# Patient Record
Sex: Female | Born: 1980
Health system: Southern US, Community
[De-identification: ages and names within clinical notes are randomized; demographics above are authoritative.]

## PROBLEM LIST (undated history)

## (undated) HISTORY — PX: FOOT SURGERY: SHX648

## (undated) HISTORY — PX: TONSILLECTOMY: SUR1361

---

## 2009-08-08 ENCOUNTER — Emergency Department (HOSPITAL_BASED_OUTPATIENT_CLINIC_OR_DEPARTMENT_OTHER): Admission: EM | Admit: 2009-08-08 | Discharge: 2009-08-08 | Payer: Self-pay | Admitting: Emergency Medicine

## 2009-08-08 ENCOUNTER — Ambulatory Visit: Payer: Self-pay | Admitting: Diagnostic Radiology

## 2010-04-26 ENCOUNTER — Emergency Department (HOSPITAL_BASED_OUTPATIENT_CLINIC_OR_DEPARTMENT_OTHER)
Admission: EM | Admit: 2010-04-26 | Discharge: 2010-04-26 | Payer: Self-pay | Source: Home / Self Care | Admitting: Emergency Medicine
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
## 2013-11-02 ENCOUNTER — Encounter (HOSPITAL_BASED_OUTPATIENT_CLINIC_OR_DEPARTMENT_OTHER): Payer: Self-pay | Admitting: Emergency Medicine

## 2013-11-02 ENCOUNTER — Emergency Department (HOSPITAL_BASED_OUTPATIENT_CLINIC_OR_DEPARTMENT_OTHER)
Admission: EM | Admit: 2013-11-02 | Discharge: 2013-11-02 | Disposition: A | Discharge status: Home / Self Care | Payer: Self-pay | Attending: Emergency Medicine | Admitting: Emergency Medicine

## 2013-11-02 DIAGNOSIS — R109 Unspecified abdominal pain: Secondary | ICD-10-CM | POA: Insufficient documentation

## 2013-11-02 DIAGNOSIS — R112 Nausea with vomiting, unspecified: Secondary | ICD-10-CM | POA: Insufficient documentation

## 2013-11-02 DIAGNOSIS — R197 Diarrhea, unspecified: Secondary | ICD-10-CM | POA: Insufficient documentation

## 2013-11-02 DIAGNOSIS — F17200 Nicotine dependence, unspecified, uncomplicated: Secondary | ICD-10-CM | POA: Insufficient documentation

## 2013-11-02 DIAGNOSIS — Z3202 Encounter for pregnancy test, result negative: Secondary | ICD-10-CM | POA: Insufficient documentation

## 2013-11-02 LAB — COMPREHENSIVE METABOLIC PANEL
ALT: 7 U/L (ref 0–35)
AST: 18 U/L (ref 0–37)
Albumin: 3.9 g/dL (ref 3.5–5.2)
Alkaline Phosphatase: 52 U/L (ref 39–117)
Anion gap: 13 (ref 5–15)
BUN: 9 mg/dL (ref 6–23)
CALCIUM: 9.7 mg/dL (ref 8.4–10.5)
CHLORIDE: 103 meq/L (ref 96–112)
CO2: 24 mEq/L (ref 19–32)
Creatinine, Ser: 0.9 mg/dL (ref 0.50–1.10)
GFR calc non Af Amer: 83 mL/min — ABNORMAL LOW (ref >90)
Glucose, Bld: 118 mg/dL — ABNORMAL HIGH (ref 70–99)
Potassium: 3.8 mEq/L (ref 3.7–5.3)
SODIUM: 140 meq/L (ref 137–147)
Total Bilirubin: 0.3 mg/dL (ref 0.3–1.2)
Total Protein: 7.4 g/dL (ref 6.0–8.3)

## 2013-11-02 LAB — URINALYSIS, ROUTINE W REFLEX MICROSCOPIC
Glucose, UA: NEGATIVE mg/dL
KETONES UR: NEGATIVE mg/dL
Nitrite: NEGATIVE
PH: 5.5 (ref 5.0–8.0)
Protein, ur: NEGATIVE mg/dL
Specific Gravity, Urine: 1.025 (ref 1.005–1.030)
UROBILINOGEN UA: 1 mg/dL (ref 0.0–1.0)

## 2013-11-02 LAB — CBC WITH DIFFERENTIAL/PLATELET
BASOS ABS: 0 10*3/uL (ref 0.0–0.1)
Basophils Relative: 0 % (ref 0–1)
Eosinophils Absolute: 0.4 10*3/uL (ref 0.0–0.7)
Eosinophils Relative: 4 % (ref 0–5)
HEMATOCRIT: 38.4 % (ref 36.0–46.0)
Hemoglobin: 14.5 g/dL (ref 12.0–15.0)
LYMPHS ABS: 2.3 10*3/uL (ref 0.7–4.0)
LYMPHS PCT: 22 % (ref 12–46)
MCH: 30.5 pg (ref 26.0–34.0)
MCHC: 37.8 g/dL — ABNORMAL HIGH (ref 30.0–36.0)
MCV: 80.7 fL (ref 78.0–100.0)
MONO ABS: 0.5 10*3/uL (ref 0.1–1.0)
Monocytes Relative: 5 % (ref 3–12)
NEUTROS ABS: 7.3 10*3/uL (ref 1.7–7.7)
Neutrophils Relative %: 69 % (ref 43–77)
PLATELETS: 250 10*3/uL (ref 150–400)
RBC: 4.76 MIL/uL (ref 3.87–5.11)
RDW: 13 % (ref 11.5–15.5)
WBC: 10.5 10*3/uL (ref 4.0–10.5)

## 2013-11-02 LAB — URINE MICROSCOPIC-ADD ON

## 2013-11-02 LAB — LIPASE, BLOOD: Lipase: 21 U/L (ref 11–59)

## 2013-11-02 LAB — PREGNANCY, URINE: PREG TEST UR: NEGATIVE

## 2013-11-02 MED ORDER — ONDANSETRON HCL 4 MG PO TABS: 4.0000 mg | ORAL_TABLET | Freq: Four times a day (QID) | ORAL | Status: DC

## 2013-11-02 MED ORDER — ONDANSETRON HCL 4 MG/2ML IJ SOLN
4.0000 mg | Freq: Once | INTRAMUSCULAR | Status: AC
  Administered 2013-11-02: 4 mg via INTRAVENOUS
  Filled 2013-11-02: qty 2

## 2013-11-02 MED ORDER — SODIUM CHLORIDE 0.9 % IV BOLUS (SEPSIS)
1000.0000 mL | Freq: Once | INTRAVENOUS | Status: AC
Start: 2013-11-02 — End: 2013-11-02
  Administered 2013-11-02: 1000 mL via INTRAVENOUS
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
## 2013-11-02 NOTE — ED Notes (Addendum)
Patient here with abdominal camping with vomiting and diarrhea with any intake. Reports that she is vomiting a couple of times each day, no other associated symptoms. Symptoms have persisted x 5 days.

## 2013-11-02 NOTE — Discharge Instructions (Signed)

## 2013-11-02 NOTE — ED Notes (Signed)
Tolerating fluids.

## 2014-04-23 ENCOUNTER — Encounter (HOSPITAL_BASED_OUTPATIENT_CLINIC_OR_DEPARTMENT_OTHER): Payer: Self-pay | Admitting: Emergency Medicine

## 2014-04-23 ENCOUNTER — Emergency Department (HOSPITAL_BASED_OUTPATIENT_CLINIC_OR_DEPARTMENT_OTHER)
Admission: EM | Admit: 2014-04-23 | Discharge: 2014-04-23 | Disposition: A | Discharge status: Home / Self Care | Payer: Self-pay | Attending: Emergency Medicine | Admitting: Emergency Medicine

## 2014-04-23 DIAGNOSIS — X58XXXD Exposure to other specified factors, subsequent encounter: Secondary | ICD-10-CM | POA: Insufficient documentation

## 2014-04-23 DIAGNOSIS — S39012D Strain of muscle, fascia and tendon of lower back, subsequent encounter: Secondary | ICD-10-CM | POA: Insufficient documentation

## 2014-04-23 DIAGNOSIS — Z720 Tobacco use: Secondary | ICD-10-CM | POA: Insufficient documentation

## 2014-04-23 MED ORDER — HYDROCODONE-ACETAMINOPHEN 5-325 MG PO TABS: 1.0000 | ORAL_TABLET | Freq: Four times a day (QID) | ORAL | Status: DC | PRN
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
## 2015-07-29 ENCOUNTER — Emergency Department (HOSPITAL_BASED_OUTPATIENT_CLINIC_OR_DEPARTMENT_OTHER)
Admission: EM | Admit: 2015-07-29 | Discharge: 2015-07-29 | Disposition: A | Discharge status: Home / Self Care | Payer: No Typology Code available for payment source | Attending: Emergency Medicine | Admitting: Emergency Medicine

## 2015-07-29 ENCOUNTER — Encounter (HOSPITAL_BASED_OUTPATIENT_CLINIC_OR_DEPARTMENT_OTHER): Payer: Self-pay | Admitting: Emergency Medicine

## 2015-07-29 DIAGNOSIS — F17210 Nicotine dependence, cigarettes, uncomplicated: Secondary | ICD-10-CM | POA: Insufficient documentation

## 2015-07-29 DIAGNOSIS — K029 Dental caries, unspecified: Secondary | ICD-10-CM | POA: Insufficient documentation

## 2015-07-29 MED ORDER — IBUPROFEN 800 MG PO TABS: 800.0000 mg | ORAL_TABLET | Freq: Three times a day (TID) | ORAL | Status: AC

## 2015-07-29 MED ORDER — PENICILLIN V POTASSIUM 500 MG PO TABS: 500.0000 mg | ORAL_TABLET | Freq: Three times a day (TID) | ORAL | Status: DC

## 2015-07-29 MED FILL — IBUPROFEN 800 MG TABLET: 800 | 7 days supply | Qty: 21 | Fill #0

## 2015-07-29 MED FILL — PENICILLIN VK 500 MG TABLET: 500 | 10 days supply | Qty: 30 | Fill #0

## 2015-07-29 NOTE — Discharge Instructions (Signed)
Dental Pain °Dental pain may be caused by many things, including: °· Tooth decay (cavities or caries). Cavities cause the nerve of your tooth to be open to air and hot or cold temperatures. This can cause pain or discomfort. °· Abscess or infection. A dental abscess is an area that is full of infected pus from a bacterial infection in the inner part of the tooth (pulp). It usually happens at the end of the tooth's root. °· Injury. °· An unknown reason (idiopathic). °Your pain may be mild or severe. It may only happen when: °· You are chewing. °· You are exposed to hot or cold temperature. °· You are eating or drinking sugary foods or beverages, such as: °¨ Soda. °¨ Candy. °Your pain may also be there all of the time. °HOME CARE °Watch your dental pain for any changes. Do these things to lessen your discomfort: °· Take medicines only as told by your dentist. °· If your dentist tells you to take an antibiotic medicine, finish all of it even if you start to feel better. °· Keep all follow-up visits as told by your dentist. This is important. °· Do not apply heat to the outside of your face. °· Rinse your mouth or gargle with salt water if told by your dentist. This helps with pain and swelling. °¨ You can make salt water by adding ¼ tsp of salt to 1 cup of warm water. °· Apply ice to the painful area of your face: °¨ Put ice in a plastic bag. °¨ Place a towel between your skin and the bag. °¨ Leave the ice on for 20 minutes, 2-3 times per day. °· Avoid foods or drinks that cause you pain, such as: °¨ Very hot or very cold foods or drinks. °¨ Sweet or sugary foods or drinks. °GET HELP IF: °· Your pain is not helped with medicines. °· Your symptoms are worse. °· You have new symptoms. °GET HELP RIGHT AWAY IF: °· You cannot open your mouth. °· You are having trouble breathing or swallowing. °· You have a fever. °· Your face, neck, or jaw is puffy (swollen). °  °This information is not intended to replace advice given to  you by your health care provider. Make sure you discuss any questions you have with your health care provider. °  °Document Released: 08/31/2007 Document Revised: 07/29/2014 Document Reviewed: 03/10/2014 °Elsevier Interactive Patient Education ©2016 [employer] ° °

## 2015-07-29 NOTE — ED Provider Notes (Signed)
CSN: [id number]     Arrival date & time 07/29/15  1602 History   First MD Initiated Contact with Patient 07/29/15 1609     Chief Complaint  Patient presents with  . Dental Pain     (Consider location/radiation/quality/duration/timing/severity/associated sxs/prior Treatment) HPI   35 year old female presents for evaluation of dental pain. Patient reports she broke her left lower tooth approximately 2 weeks ago but for the past 4 days she has had progressive worsening throbbing achy pain which she rates as 8 out of 10, nonradiating, worsening with chewing, talking and with temperature changes. No associated fever, throat swelling, ear pain, or neck pain. No specific treatment tried. She did call her dentist and her next available appointment is June 15. She did try to call around to find a different dentist but unable to find anyone to follow-up. She denies any pain to the same tooth in the past.  No past medical history on file. No past surgical history on file. No family history on file. Social History  Substance Use Topics  . Smoking status: Current Every Day Smoker    Types: Cigarettes  . Smokeless tobacco: Not on file  . Alcohol Use: No   OB History    No data available     Review of Systems  Constitutional: Negative for fever.  HENT: Positive for dental problem.   Skin: Negative for rash and wound.  Neurological: Negative for numbness.      Allergies  Review of patient's allergies indicates no known allergies.  Home Medications   Prior to Admission medications   Medication Sig Start Date End Date Taking? Authorizing Provider  HYDROcodone-acetaminophen (NORCO/VICODIN) 5-325 MG per tablet Take 1-2 tablets by mouth every 6 (six) hours as needed (for pain; may cause drowsiness or constipation). 04/23/14   John Molpus, MD  ondansetron (ZOFRAN) 4 MG tablet Take 1 tablet (4 mg total) by mouth every 6 (six) hours. 11/02/13   Toy CookeyMegan Docherty, MD   There were no vitals taken for  this visit. Physical Exam  Constitutional: She appears well-developed and well-nourished. No distress.  HENT:  Head: Atraumatic.  Oral cavity: Dental decay noted to tooth #17 with tenderness to palpation but no surrounding erythema or abscess. No trismus. Left ear with normal TM and nontender to palpation.   Eyes: Conjunctivae are normal.  Neck: Neck supple.  Neurological: She is alert.  Skin: No rash noted.  Psychiatric: She has a normal mood and affect.  Nursing note and vitals reviewed.   ED Course  Procedures (including critical care time)   MDM   Final diagnoses:  Pain due to dental caries    BP 114/83 mmHg  Pulse 82  Temp(Src) 98.5 F (36.9 C) (Oral)  Resp 16  Ht 5\' 4"  (1.626 m)  Wt 72.576 kg  BMI 27.45 kg/m2  SpO2 99%  LMP 06/29/2015     Fayrene HelperBowie Celesta Funderburk, PA-C 07/29/15 1618  Pricilla LovelessScott Goldston, MD 07/31/15 [phone]

## 2015-07-29 NOTE — ED Notes (Signed)
PA at bedside.

## 2015-07-29 NOTE — ED Notes (Addendum)
Pt broke tooth on left bottom 2 weeks ago.  Pain x3 days.  Dental appt June 15th.

## 2016-01-23 ENCOUNTER — Emergency Department (HOSPITAL_BASED_OUTPATIENT_CLINIC_OR_DEPARTMENT_OTHER)
Admission: EM | Admit: 2016-01-23 | Discharge: 2016-01-23 | Disposition: A | Discharge status: Home / Self Care | Payer: Self-pay | Attending: Emergency Medicine | Admitting: Emergency Medicine

## 2016-01-23 ENCOUNTER — Encounter (HOSPITAL_BASED_OUTPATIENT_CLINIC_OR_DEPARTMENT_OTHER): Payer: Self-pay

## 2016-01-23 DIAGNOSIS — K0889 Other specified disorders of teeth and supporting structures: Secondary | ICD-10-CM | POA: Insufficient documentation

## 2016-01-23 DIAGNOSIS — Z79899 Other long term (current) drug therapy: Secondary | ICD-10-CM | POA: Insufficient documentation

## 2016-01-23 DIAGNOSIS — F17210 Nicotine dependence, cigarettes, uncomplicated: Secondary | ICD-10-CM | POA: Insufficient documentation

## 2016-01-23 MED ORDER — PENICILLIN V POTASSIUM 500 MG PO TABS: 500.0000 mg | ORAL_TABLET | Freq: Four times a day (QID) | ORAL | 0 refills | Status: AC

## 2016-01-23 MED ORDER — HYDROCODONE-ACETAMINOPHEN 5-325 MG PO TABS: 1.0000 | ORAL_TABLET | ORAL | 0 refills | Status: AC | PRN

## 2016-01-23 MED ORDER — FLUCONAZOLE 150 MG PO TABS: ORAL_TABLET | ORAL | 0 refills | Status: AC

## 2016-01-23 MED ORDER — BUPIVACAINE-EPINEPHRINE (PF) 0.5% -1:200000 IJ SOLN
INTRAMUSCULAR | Status: AC
  Administered 2016-01-23: 1.8 mL
  Filled 2016-01-23: qty 1.8

## 2016-01-23 MED ORDER — PENICILLIN V POTASSIUM 250 MG PO TABS
500.0000 mg | ORAL_TABLET | Freq: Once | ORAL | Status: AC
  Administered 2016-01-23: 500 mg via ORAL
  Filled 2016-01-23: qty 2

## 2016-01-23 MED ORDER — BUPIVACAINE-EPINEPHRINE (PF) 0.5% -1:200000 IJ SOLN
1.8000 mL | Freq: Once | INTRAMUSCULAR | Status: AC
  Administered 2016-01-23: 1.8 mL

## 2016-01-23 NOTE — ED Provider Notes (Addendum)
[facility] Provider Note: Sabrina Weiss Sabrina Sakuma, MD, FACEP  CSN: [id number] MRN: [id number] ARRIVAL: 01/23/16 at 0206 ROOM: MH09/MH09   CHIEF COMPLAINT  Dental Pain   HISTORY OF PRESENT ILLNESS  Sabrina Weiss is a 35 y.o. female who fractured her left lower third molar several months ago. She has been seen for this in the past. She was unable to follow-up with a dentist due to financial reasons. She is here with severe pain in that tooth that developed over the past 2 days. Pain is worse with eating or drinking. There is associated adjacent soft tissue swelling.   History reviewed. No pertinent past medical history.  Past Surgical History:  Procedure Laterality Date  . FOOT SURGERY    . TONSILLECTOMY      No family history on file.  Social History  Substance Use Topics  . Smoking status: Current Every Day Smoker    Packs/day: 0.50    Types: Cigarettes  . Smokeless tobacco: Never Used  . Alcohol use No    Prior to Admission medications   Medication Sig Start Date End Date Taking? Authorizing Provider  HYDROcodone-acetaminophen (NORCO) 5-325 MG tablet Take 1-2 tablets by mouth every 4 (four) hours as needed (for pain). 01/23/16   Nitasha Jewel, MD  ibuprofen (ADVIL,MOTRIN) 800 MG tablet Take 1 tablet (800 mg total) by mouth 3 (three) times daily. 07/29/15   Fayrene HelperBowie Tran, PA-C  penicillin v potassium (VEETID) 500 MG tablet Take 1 tablet (500 mg total) by mouth 4 (four) times daily. 01/23/16   Paula LibraJohn Lily Velasquez, MD    Allergies Review of patient's allergies indicates no known allergies.   REVIEW OF SYSTEMS  Negative except as noted here or in the History of Present Illness.   PHYSICAL EXAMINATION  Initial Vital Signs Blood pressure 132/99, pulse 86, temperature 97.4 F (36.3 C), temperature source Oral, resp. rate 18, height 5\' 4"  (1.626 m), weight 150 lb (68 kg), last menstrual period 01/03/2016, SpO2 100 %.  Examination General: Well-developed, well-nourished  female in no acute distress; appearance consistent with age of record HENT: normocephalic; atraumatic; fractured left lower third molar with tenderness to percussion, adjacent soft tissue swelling Eyes: pupils equal, round and reactive to light; extraocular muscles intact Neck: supple; no lymphadenopathy Heart: regular rate and rhythm Lungs: clear to auscultation bilaterally Abdomen: soft; nondistended Extremities: No deformity; full range of motion Neurologic: Awake, alert and oriented; motor function intact in all extremities and symmetric; no facial droop Skin: Warm and dry Psychiatric: Tearful   RESULTS  Summary of this visit's results, reviewed by myself:   EKG Interpretation  Date/Time:    Ventricular Rate:    PR Interval:    QRS Duration:   QT Interval:    QTC Calculation:   R Axis:     Text Interpretation:        Laboratory Studies: No results found for this or any previous visit (from the past 24 hour(s)). Imaging Studies: No results found.  ED COURSE  Nursing notes and initial vitals signs, including pulse oximetry, reviewed.  Vitals:   01/23/16 0209  BP: 132/99  Pulse: 86  Resp: 18  Temp: 97.4 F (36.3 C)  TempSrc: Oral  SpO2: 100%  Weight: 150 lb (68 kg)  Height: 5\' 4"  (1.626 m)    PROCEDURES   DENTAL BLOCK 1.8 milliliters of 0.5% bupivacaine with epinephrine were injected into the buccal fold adjacent to the left lower third molar. The patient tolerated this well and there were  no immediate complications. Adequate analgesia was obtained.   ED DIAGNOSES     ICD-9-CM ICD-10-CM   1. Pain, dental 525.9 K08.89        Paula LibraJohn Jebediah Macrae, MD 01/23/16 0300    Paula LibraJohn Margeart Allender, MD 01/23/16 [phone]

## 2016-01-23 NOTE — ED Triage Notes (Signed)
Pt states chipped her lt bottom tooth a few months ago, now having swelling and pain x2 days

## 2020-01-30 ENCOUNTER — Other Ambulatory Visit: Payer: Self-pay

## 2020-01-30 DIAGNOSIS — Z8669 Personal history of other diseases of the nervous system and sense organs: Secondary | ICD-10-CM | POA: Insufficient documentation

## 2020-01-30 DIAGNOSIS — R42 Dizziness and giddiness: Secondary | ICD-10-CM | POA: Insufficient documentation

## 2020-01-30 DIAGNOSIS — R519 Headache, unspecified: Secondary | ICD-10-CM | POA: Insufficient documentation

## 2020-01-30 DIAGNOSIS — F17210 Nicotine dependence, cigarettes, uncomplicated: Secondary | ICD-10-CM | POA: Insufficient documentation

## 2020-01-31 ENCOUNTER — Other Ambulatory Visit: Payer: Self-pay

## 2020-01-31 ENCOUNTER — Encounter (HOSPITAL_BASED_OUTPATIENT_CLINIC_OR_DEPARTMENT_OTHER): Payer: Self-pay | Admitting: *Deleted

## 2020-01-31 ENCOUNTER — Emergency Department (HOSPITAL_BASED_OUTPATIENT_CLINIC_OR_DEPARTMENT_OTHER)
Admission: EM | Admit: 2020-01-31 | Discharge: 2020-01-31 | Disposition: A | Discharge status: Home / Self Care | Payer: Self-pay | Attending: Emergency Medicine | Admitting: Emergency Medicine

## 2020-01-31 DIAGNOSIS — R519 Headache, unspecified: Secondary | ICD-10-CM

## 2020-01-31 MED ORDER — PROMETHAZINE HCL 25 MG/ML IJ SOLN
25.0000 mg | Freq: Once | INTRAMUSCULAR | Status: AC
  Administered 2020-01-31: 25 mg via INTRAMUSCULAR
  Filled 2020-01-31: qty 1

## 2020-01-31 MED ORDER — KETOROLAC TROMETHAMINE 60 MG/2ML IM SOLN
60.0000 mg | Freq: Once | INTRAMUSCULAR | Status: AC
  Administered 2020-01-31: 60 mg via INTRAMUSCULAR
  Filled 2020-01-31: qty 2

## 2020-01-31 NOTE — ED Notes (Signed)
Discharge instructions discussed with patient. Verbalized understanding. Departs ED at this time in stable condition.  

## 2020-01-31 NOTE — ED Triage Notes (Signed)
C/o left sided h/a and lightheaded  x 3 hrs

## 2020-01-31 NOTE — ED Provider Notes (Signed)
[facility] EMERGENCY DEPARTMENT Provider Note   CSN: [id number] Arrival date & time: 01/30/20  2356     History Chief Complaint  Patient presents with  . Headache    Sabrina Weiss is a 39 y.o. female.  Patient is a 39 year old female with no significant past medical history.  She presents today for evaluation of headache.  This started approximately 9:00 this evening while she was getting ready for work.  She describes pain to the left side of her head with no associated nausea or visual disturbances.  She denies any numbness or tingling.  She felt dizzy and did not feel as though she could make it to work.  She does report a history of migraine headaches in the past and this feels somewhat similar.  She does report daily caffeine intake.  The history is provided by the patient.       History reviewed. No pertinent past medical history.  There are no problems to display for this patient.   Past Surgical History:  Procedure Laterality Date  . FOOT SURGERY    . TONSILLECTOMY       OB History   No obstetric history on file.     No family history on file.  Social History   Tobacco Use  . Smoking status: Current Every Day Smoker    Packs/day: 0.50    Types: Cigarettes  . Smokeless tobacco: Never Used  Substance Use Topics  . Alcohol use: No  . Drug use: No    Home Medications Prior to Admission medications   Medication Sig Start Date End Date Taking? Authorizing Provider  fluconazole (DIFLUCAN) 150 MG tablet Take one tablet as needed for vaginal yeast infection. May repeat in 3 days if necessary. 01/23/16   Molpus, John, MD  HYDROcodone-acetaminophen (NORCO) 5-325 MG tablet Take 1-2 tablets by mouth every 4 (four) hours as needed (for pain). 01/23/16   Molpus, John, MD  ibuprofen (ADVIL,MOTRIN) 800 MG tablet Take 1 tablet (800 mg total) by mouth 3 (three) times daily. 07/29/15   Fayrene Helper, PA-C  penicillin v potassium (VEETID) 500 MG tablet  Take 1 tablet (500 mg total) by mouth 4 (four) times daily. 01/23/16   Molpus, Jonny Ruiz, MD    Allergies    Patient has no known allergies.  Review of Systems   Review of Systems  All other systems reviewed and are negative.   Physical Exam Updated Vital Signs BP 126/82   Pulse 73   Temp 97.7 F (36.5 C) (Oral)   Resp 18   Ht 5\' 3"  (1.6 m)   Wt 72.6 kg   LMP 01/08/2020   SpO2 100%   BMI 28.34 kg/m   Physical Exam Vitals and nursing note reviewed.  Constitutional:      General: She is not in acute distress.    Appearance: She is well-developed. She is not diaphoretic.  HENT:     Head: Normocephalic and atraumatic.  Eyes:     General: No visual field deficit.    Extraocular Movements: Extraocular movements intact.  Cardiovascular:     Rate and Rhythm: Normal rate and regular rhythm.     Heart sounds: No murmur heard.  No friction rub. No gallop.   Pulmonary:     Effort: Pulmonary effort is normal. No respiratory distress.     Breath sounds: Normal breath sounds. No wheezing.  Abdominal:     General: Bowel sounds are normal. There is no distension.  Palpations: Abdomen is soft.     Tenderness: There is no abdominal tenderness.  Musculoskeletal:        General: Normal range of motion.     Cervical back: Normal range of motion and neck supple.  Skin:    General: Skin is warm and dry.  Neurological:     Mental Status: She is alert and oriented to person, place, and time.     Cranial Nerves: No cranial nerve deficit or facial asymmetry.     Sensory: No sensory deficit.     Motor: No weakness.     Coordination: Coordination normal.     ED Results / Procedures / Treatments   Labs (all labs ordered are listed, but only abnormal results are displayed) Labs Reviewed - No data to display  EKG None  Radiology No results found.  Procedures Procedures (including critical care time)  Medications Ordered in ED Medications  ketorolac (TORADOL) injection 60 mg  (has no administration in time range)  promethazine (PHENERGAN) injection 25 mg (has no administration in time range)    ED Course  I have reviewed the triage vital signs and the nursing notes.  Pertinent labs & imaging results that were available during my care of the patient were reviewed by me and considered in my medical decision making (see chart for details).    MDM Rules/Calculators/A&P  Patient presenting today with complaints of headache.  This came on acutely this evening.  She is neurologically intact and feeling better after receiving Toradol and Phenergan.  I see no indication for imaging studies.  I suspect a migraine-like phenomenon.  She is to return as needed if symptoms worsen.  Final Clinical Impression(s) / ED Diagnoses Final diagnoses:  None    Rx / DC Orders ED Discharge Orders    None       Geoffery Lyons, MD 01/31/20 [phone]

## 2020-01-31 NOTE — Discharge Instructions (Signed)
Take ibuprofen 600 mg every 6 hours as needed for pain. ? ?Follow-up with primary doctor if not improving in the next few days, and return to the ER if symptoms significantly worsen or change. ?

## 2020-08-12 ENCOUNTER — Other Ambulatory Visit: Payer: Self-pay

## 2020-08-12 ENCOUNTER — Emergency Department (HOSPITAL_BASED_OUTPATIENT_CLINIC_OR_DEPARTMENT_OTHER)
Admission: EM | Admit: 2020-08-12 | Discharge: 2020-08-12 | Disposition: A | Discharge status: Home / Self Care | Payer: Medicaid Other | Attending: Emergency Medicine | Admitting: Emergency Medicine

## 2020-08-12 ENCOUNTER — Encounter (HOSPITAL_BASED_OUTPATIENT_CLINIC_OR_DEPARTMENT_OTHER): Payer: Self-pay | Admitting: Emergency Medicine

## 2020-08-12 DIAGNOSIS — A084 Viral intestinal infection, unspecified: Secondary | ICD-10-CM | POA: Insufficient documentation

## 2020-08-12 DIAGNOSIS — F17210 Nicotine dependence, cigarettes, uncomplicated: Secondary | ICD-10-CM | POA: Insufficient documentation

## 2020-08-12 LAB — CBC WITH DIFFERENTIAL/PLATELET
Abs Immature Granulocytes: 0.01 10*3/uL (ref 0.00–0.07)
Basophils Absolute: 0.1 10*3/uL (ref 0.0–0.1)
Basophils Relative: 1 %
Eosinophils Absolute: 0.4 10*3/uL (ref 0.0–0.5)
Eosinophils Relative: 4 %
HCT: 36.1 % (ref 36.0–46.0)
Hemoglobin: 13.5 g/dL (ref 12.0–15.0)
Immature Granulocytes: 0 %
Lymphocytes Relative: 34 %
Lymphs Abs: 3 10*3/uL (ref 0.7–4.0)
MCH: 29.5 pg (ref 26.0–34.0)
MCHC: 37.4 g/dL — ABNORMAL HIGH (ref 30.0–36.0)
MCV: 79 fL — ABNORMAL LOW (ref 80.0–100.0)
Monocytes Absolute: 0.5 10*3/uL (ref 0.1–1.0)
Monocytes Relative: 5 %
Neutro Abs: 5 10*3/uL (ref 1.7–7.7)
Neutrophils Relative %: 56 %
Platelets: 240 10*3/uL (ref 150–400)
RBC: 4.57 MIL/uL (ref 3.87–5.11)
RDW: 12.9 % (ref 11.5–15.5)
WBC: 9 10*3/uL (ref 4.0–10.5)
nRBC: 0 % (ref 0.0–0.2)

## 2020-08-12 LAB — URINALYSIS, ROUTINE W REFLEX MICROSCOPIC
Bilirubin Urine: NEGATIVE
Glucose, UA: NEGATIVE mg/dL
Ketones, ur: NEGATIVE mg/dL
Leukocytes,Ua: NEGATIVE
Nitrite: NEGATIVE
Protein, ur: NEGATIVE mg/dL
Specific Gravity, Urine: >1.03 — ABNORMAL HIGH (ref 1.005–1.030)
pH: 5.5 (ref 5.0–8.0)

## 2020-08-12 LAB — BASIC METABOLIC PANEL
Anion gap: 8 (ref 5–15)
BUN: 7 mg/dL (ref 6–20)
CO2: 23 mmol/L (ref 22–32)
Calcium: 8.8 mg/dL — ABNORMAL LOW (ref 8.9–10.3)
Chloride: 106 mmol/L (ref 98–111)
Creatinine, Ser: 0.62 mg/dL (ref 0.44–1.00)
GFR, Estimated: >60 mL/min (ref >60)
Glucose, Bld: 89 mg/dL (ref 70–99)
Potassium: 3.6 mmol/L (ref 3.5–5.1)
Sodium: 137 mmol/L (ref 135–145)

## 2020-08-12 LAB — PREGNANCY, URINE: Preg Test, Ur: NEGATIVE

## 2020-08-12 LAB — URINALYSIS, MICROSCOPIC (REFLEX)

## 2020-08-12 MED ORDER — LOPERAMIDE HCL 2 MG PO CAPS: 2.0000 mg | ORAL_CAPSULE | Freq: Four times a day (QID) | ORAL | 0 refills | Status: AC | PRN

## 2020-08-12 MED ORDER — SODIUM CHLORIDE 0.9 % IV BOLUS
500.0000 mL | Freq: Once | INTRAVENOUS | Status: AC
  Administered 2020-08-12: 500 mL via INTRAVENOUS

## 2020-08-12 NOTE — ED Triage Notes (Signed)
Pt c/o increase urination x Sunday with abd pain that increases upon urination. Pt aaox3, ambulatory with steady gait, VSS, GCS 15, NAD noted. Denies any discharge at this time.

## 2020-08-12 NOTE — ED Provider Notes (Signed)
[facility] EMERGENCY DEPARTMENT Provider Note   CSN: [id number] Arrival date & time: 08/12/20  1241     History Chief Complaint  Patient presents with  . Urinary Frequency  . Abdominal Pain    Sabrina Weiss is a 40 y.o. female.  Patient presents to ED with complaint of diarrhea on Sunday. She endorses multiple episodes per day. She denies nausea or emesis. Normal urination, no pain or burning. She has not tried anything for the symptoms.  The history is provided by the patient.  Urinary Frequency This is a new problem. The current episode started more than 2 days ago. The problem occurs hourly. The problem has not changed since onset.Associated symptoms comments: Rectal discomfort.  Abdominal Pain Associated symptoms: diarrhea   Associated symptoms: no fever, no nausea and no vomiting   Diarrhea Associated symptoms: no fever and no vomiting        History reviewed. No pertinent past medical history.  There are no problems to display for this patient.   Past Surgical History:  Procedure Laterality Date  . FOOT SURGERY    . TONSILLECTOMY       OB History   No obstetric history on file.     No family history on file.  Social History   Tobacco Use  . Smoking status: Current Every Day Smoker    Packs/day: 0.50    Types: Cigarettes  . Smokeless tobacco: Never Used  Substance Use Topics  . Alcohol use: No  . Drug use: No    Home Medications Prior to Admission medications   Medication Sig Start Date End Date Taking? Authorizing Provider  fluconazole (DIFLUCAN) 150 MG tablet Take one tablet as needed for vaginal yeast infection. May repeat in 3 days if necessary. 01/23/16   Molpus, John, MD  HYDROcodone-acetaminophen (NORCO) 5-325 MG tablet Take 1-2 tablets by mouth every 4 (four) hours as needed (for pain). 01/23/16   Molpus, John, MD  ibuprofen (ADVIL,MOTRIN) 800 MG tablet Take 1 tablet (800 mg total) by mouth 3 (three) times daily. 07/29/15    Fayrene Helper, PA-C  penicillin v potassium (VEETID) 500 MG tablet Take 1 tablet (500 mg total) by mouth 4 (four) times daily. 01/23/16   Molpus, Jonny Ruiz, MD    Allergies    Patient has no known allergies.  Review of Systems   Review of Systems  Constitutional: Negative for fever.  Gastrointestinal: Positive for diarrhea and rectal pain. Negative for abdominal distention, blood in stool, nausea and vomiting.  Genitourinary: Negative for difficulty urinating, frequency and urgency.  All other systems reviewed and are negative.   Physical Exam Updated Vital Signs BP 120/86 (BP Location: Right Arm)   Pulse 69   Temp 98.3 F (36.8 C) (Oral)   Resp 18   Ht 5\' 3"  (1.6 m)   Wt 68 kg   LMP 08/12/2020 (Exact Date)   SpO2 99%   BMI 26.57 kg/m   Physical Exam Vitals and nursing note reviewed.  Constitutional:      Appearance: She is well-developed. She is not ill-appearing.  HENT:     Mouth/Throat:     Mouth: Mucous membranes are moist.  Eyes:     Conjunctiva/sclera: Conjunctivae normal.  Cardiovascular:     Rate and Rhythm: Normal rate and regular rhythm.  Pulmonary:     Effort: Pulmonary effort is normal.     Breath sounds: Normal breath sounds.  Abdominal:     General: Abdomen is flat. Bowel sounds are normal.  There is no distension.     Palpations: Abdomen is soft.     Tenderness: There is no abdominal tenderness.  Skin:    General: Skin is warm and dry.  Neurological:     Mental Status: She is alert and oriented to person, place, and time.  Psychiatric:        Mood and Affect: Mood normal.        Behavior: Behavior normal.     ED Results / Procedures / Treatments   Labs (all labs ordered are listed, but only abnormal results are displayed) Labs Reviewed  URINALYSIS, ROUTINE W REFLEX MICROSCOPIC - Abnormal; Notable for the following components:      Result Value   Specific Gravity, Urine >1.030 (*)    Hgb urine dipstick MODERATE (*)    All other components  within normal limits  URINALYSIS, MICROSCOPIC (REFLEX) - Abnormal; Notable for the following components:   Bacteria, UA FEW (*)    All other components within normal limits  CBC WITH DIFFERENTIAL/PLATELET - Abnormal; Notable for the following components:   MCV 79.0 (*)    MCHC 37.4 (*)    All other components within normal limits  BASIC METABOLIC PANEL - Abnormal; Notable for the following components:   Calcium 8.8 (*)    All other components within normal limits  PREGNANCY, URINE    EKG None  Radiology No results found.  Procedures Procedures   Medications Ordered in ED Medications  sodium chloride 0.9 % bolus 500 mL (0 mLs Intravenous Stopped 08/12/20 1654)    ED Course  I have reviewed the triage vital signs and the nursing notes.  Pertinent labs & imaging results that were available during my care of the patient were reviewed by me and considered in my medical decision making (see chart for details).    MDM Rules/Calculators/A&P                          Patient's symptoms appear to be viral in nature. Patient appears will appearing and is expected to have transient course of illness. Patient feels better after IV fluids, tolerating po fluids, no signs of dehydration. Care instructions and return precautions provided. Patient appears safe for discharge at this time. Final Clinical Impression(s) / ED Diagnoses Final diagnoses:  Viral diarrhea    Rx / DC Orders ED Discharge Orders         Ordered    loperamide (IMODIUM) 2 MG capsule  4 times daily PRN        08/12/20 1631           Felicie Morn, NP 08/13/20 0056    Milagros Loll, MD 08/19/20 [phone]

## 2020-08-12 NOTE — ED Provider Notes (Incomplete)
[facility] EMERGENCY DEPARTMENT Provider Note   CSN: [id number] Arrival date & time: 08/12/20  1241     History Chief Complaint  Patient presents with  . Urinary Frequency  . Abdominal Pain    Sabrina Weiss is a 40 y.o. female.  Patient presents to ED with complaint of diarrhea on Sunday. She endorses multiple episodes per day. She denies nausea or emesis. Normal urination, no pain or burning. She has not tried anything for the symptoms.  The history is provided by the patient.  Urinary Frequency This is a new problem. The current episode started more than 2 days ago. The problem occurs hourly. The problem has not changed since onset.Associated symptoms comments: Rectal discomfort.  Abdominal Pain Associated symptoms: diarrhea   Associated symptoms: no fever, no nausea and no vomiting   Diarrhea Associated symptoms: no fever and no vomiting        History reviewed. No pertinent past medical history.  There are no problems to display for this patient.   Past Surgical History:  Procedure Laterality Date  . FOOT SURGERY    . TONSILLECTOMY       OB History   No obstetric history on file.     No family history on file.  Social History   Tobacco Use  . Smoking status: Current Every Day Smoker    Packs/day: 0.50    Types: Cigarettes  . Smokeless tobacco: Never Used  Substance Use Topics  . Alcohol use: No  . Drug use: No    Home Medications Prior to Admission medications   Medication Sig Start Date End Date Taking? Authorizing Provider  fluconazole (DIFLUCAN) 150 MG tablet Take one tablet as needed for vaginal yeast infection. May repeat in 3 days if necessary. 01/23/16   Molpus, John, MD  HYDROcodone-acetaminophen (NORCO) 5-325 MG tablet Take 1-2 tablets by mouth every 4 (four) hours as needed (for pain). 01/23/16   Molpus, John, MD  ibuprofen (ADVIL,MOTRIN) 800 MG tablet Take 1 tablet (800 mg total) by mouth 3 (three) times daily. 07/29/15    Fayrene Helper, PA-C  penicillin v potassium (VEETID) 500 MG tablet Take 1 tablet (500 mg total) by mouth 4 (four) times daily. 01/23/16   Molpus, Jonny Ruiz, MD    Allergies    Patient has no known allergies.  Review of Systems   Review of Systems  Constitutional: Negative for fever.  Gastrointestinal: Positive for diarrhea and rectal pain. Negative for abdominal distention, blood in stool, nausea and vomiting.  Genitourinary: Negative for difficulty urinating, frequency and urgency.  All other systems reviewed and are negative.   Physical Exam Updated Vital Signs BP 120/86 (BP Location: Right Arm)   Pulse 69   Temp 98.3 F (36.8 C) (Oral)   Resp 18   Ht 5\' 3"  (1.6 m)   Wt 68 kg   LMP 08/12/2020 (Exact Date)   SpO2 99%   BMI 26.57 kg/m   Physical Exam Vitals and nursing note reviewed.  Constitutional:      Appearance: She is well-developed. She is not ill-appearing.  HENT:     Mouth/Throat:     Mouth: Mucous membranes are moist.  Eyes:     Conjunctiva/sclera: Conjunctivae normal.  Cardiovascular:     Rate and Rhythm: Normal rate and regular rhythm.  Pulmonary:     Effort: Pulmonary effort is normal.     Breath sounds: Normal breath sounds.  Abdominal:     General: Abdomen is flat. Bowel sounds are normal.  There is no distension.     Palpations: Abdomen is soft.     Tenderness: There is no abdominal tenderness.  Skin:    General: Skin is warm and dry.  Neurological:     Mental Status: She is alert and oriented to person, place, and time.  Psychiatric:        Mood and Affect: Mood normal.        Behavior: Behavior normal.     ED Results / Procedures / Treatments   Labs (all labs ordered are listed, but only abnormal results are displayed) Labs Reviewed  URINALYSIS, ROUTINE W REFLEX MICROSCOPIC - Abnormal; Notable for the following components:      Result Value   Specific Gravity, Urine >1.030 (*)    Hgb urine dipstick MODERATE (*)    All other components  within normal limits  URINALYSIS, MICROSCOPIC (REFLEX) - Abnormal; Notable for the following components:   Bacteria, UA FEW (*)    All other components within normal limits  CBC WITH DIFFERENTIAL/PLATELET - Abnormal; Notable for the following components:   MCV 79.0 (*)    MCHC 37.4 (*)    All other components within normal limits  BASIC METABOLIC PANEL - Abnormal; Notable for the following components:   Calcium 8.8 (*)    All other components within normal limits  PREGNANCY, URINE    EKG None  Radiology No results found.  Procedures Procedures {Remember to document critical care time when appropriate:1}  Medications Ordered in ED Medications  sodium chloride 0.9 % bolus 500 mL (0 mLs Intravenous Stopped 08/12/20 1654)    ED Course  I have reviewed the triage vital signs and the nursing notes.  Pertinent labs & imaging results that were available during my care of the patient were reviewed by me and considered in my medical decision making (see chart for details).    MDM Rules/Calculators/A&P                          Patient's symptoms appear to be viral in nature. Patient appears will appearing and is expected to have transient course of illness. Patient feels better after IV fluids, tolerating po fluids, no signs of dehydration Final Clinical Impression(s) / ED Diagnoses Final diagnoses:  Viral diarrhea    Rx / DC Orders ED Discharge Orders         Ordered    loperamide (IMODIUM) 2 MG capsule  4 times daily PRN        08/12/20 1631

## 2020-08-12 NOTE — ED Notes (Signed)
Pt discharged to home.  Discharge instructions have been discussed with pt and/or family members.  Pt verbally acknowledges understanding of discharge instructions and endorses comprehension to check out at registration prior to leaving.
